# Patient Record
Sex: Male | Born: 1975 | Race: Black or African American | Hispanic: No | Smoking: Never smoker
Health system: Southern US, Community
[De-identification: ages and names within clinical notes are randomized; demographics above are authoritative.]

## PROBLEM LIST (undated history)

## (undated) DIAGNOSIS — K219 Gastro-esophageal reflux disease without esophagitis: Secondary | ICD-10-CM

## (undated) DIAGNOSIS — M5412 Radiculopathy, cervical region: Secondary | ICD-10-CM

## (undated) DIAGNOSIS — F99 Mental disorder, not otherwise specified: Secondary | ICD-10-CM

## (undated) HISTORY — DX: Mental disorder, not otherwise specified: F99

## (undated) HISTORY — DX: Gastro-esophageal reflux disease without esophagitis: K21.9

## (undated) HISTORY — PX: NECK SURGERY: SHX720

## (undated) HISTORY — DX: Radiculopathy, cervical region: M54.12

---

## 2007-09-01 ENCOUNTER — Emergency Department (HOSPITAL_COMMUNITY): Admission: EM | Admit: 2007-09-01 | Discharge: 2007-09-01 | Payer: Self-pay | Admitting: Emergency Medicine

## 2018-11-10 ENCOUNTER — Encounter (HOSPITAL_COMMUNITY): Payer: Self-pay

## 2018-11-10 ENCOUNTER — Other Ambulatory Visit: Payer: Self-pay

## 2018-11-10 ENCOUNTER — Emergency Department (HOSPITAL_COMMUNITY): Payer: Self-pay

## 2018-11-10 ENCOUNTER — Emergency Department (HOSPITAL_COMMUNITY)
Admission: EM | Admit: 2018-11-10 | Discharge: 2018-11-11 | Discharge status: Against Medical Advice | Payer: Self-pay | Attending: Emergency Medicine | Admitting: Emergency Medicine

## 2018-11-10 DIAGNOSIS — Z5321 Procedure and treatment not carried out due to patient leaving prior to being seen by health care provider: Secondary | ICD-10-CM | POA: Insufficient documentation

## 2018-11-10 LAB — TROPONIN I (HIGH SENSITIVITY): Troponin I (High Sensitivity): <2 ng/L (ref <18)

## 2018-11-10 LAB — CBC
HCT: 38.5 % — ABNORMAL LOW (ref 39.0–52.0)
Hemoglobin: 13.6 g/dL (ref 13.0–17.0)
MCH: 32.2 pg (ref 26.0–34.0)
MCHC: 35.3 g/dL (ref 30.0–36.0)
MCV: 91 fL (ref 80.0–100.0)
Platelets: 131 10*3/uL — ABNORMAL LOW (ref 150–400)
RBC: 4.23 MIL/uL (ref 4.22–5.81)
RDW: 12.6 % (ref 11.5–15.5)
WBC: 24.2 10*3/uL — ABNORMAL HIGH (ref 4.0–10.5)
nRBC: 0 % (ref 0.0–0.2)

## 2018-11-10 LAB — BASIC METABOLIC PANEL
Anion gap: 12 (ref 5–15)
BUN: 12 mg/dL (ref 6–20)
CO2: 23 mmol/L (ref 22–32)
Calcium: 8.6 mg/dL — ABNORMAL LOW (ref 8.9–10.3)
Chloride: 103 mmol/L (ref 98–111)
Creatinine, Ser: 1.18 mg/dL (ref 0.61–1.24)
GFR calc Af Amer: >60 mL/min (ref >60)
GFR calc non Af Amer: >60 mL/min (ref >60)
Glucose, Bld: 103 mg/dL — ABNORMAL HIGH (ref 70–99)
Potassium: 3.2 mmol/L — ABNORMAL LOW (ref 3.5–5.1)
Sodium: 138 mmol/L (ref 135–145)

## 2018-11-10 MED ORDER — SODIUM CHLORIDE 0.9% FLUSH: 3.0000 mL | Freq: Once | INTRAVENOUS | Status: DC

## 2018-11-10 NOTE — ED Notes (Signed)
Called pt no answer x1 

## 2018-11-10 NOTE — ED Triage Notes (Signed)
Pt reports chest pain and sob since yesterday. Resp e.u, nad noted.

## 2018-11-11 NOTE — ED Notes (Signed)
Pt called x3. No reply. 

## 2020-02-19 IMAGING — CR DG CHEST 2V
2 series · 2 of 2 positions shown · non-contrast
Comparison: None

CLINICAL DATA: Chest pain and tightness.  Shortness of breath.

EXAM:
CHEST - 2 VIEW

[chest pa]
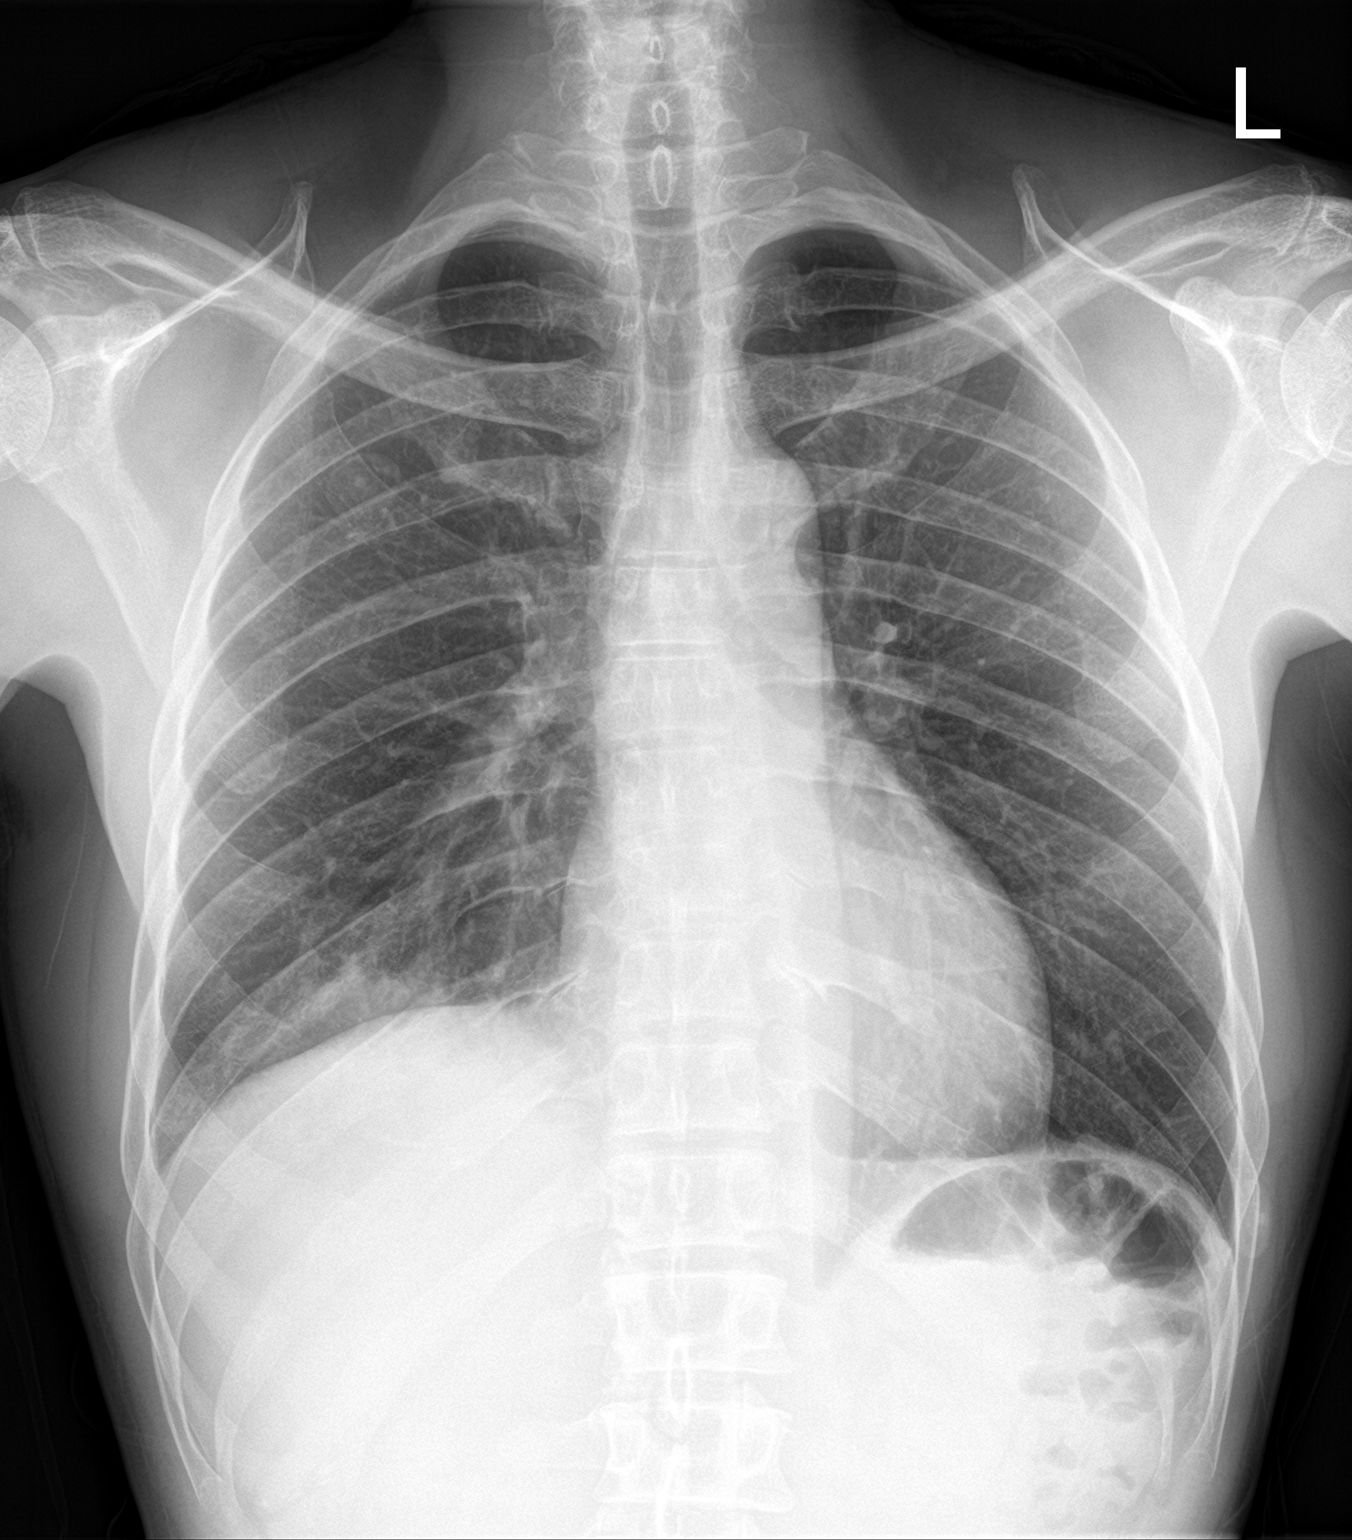

[chest lat]
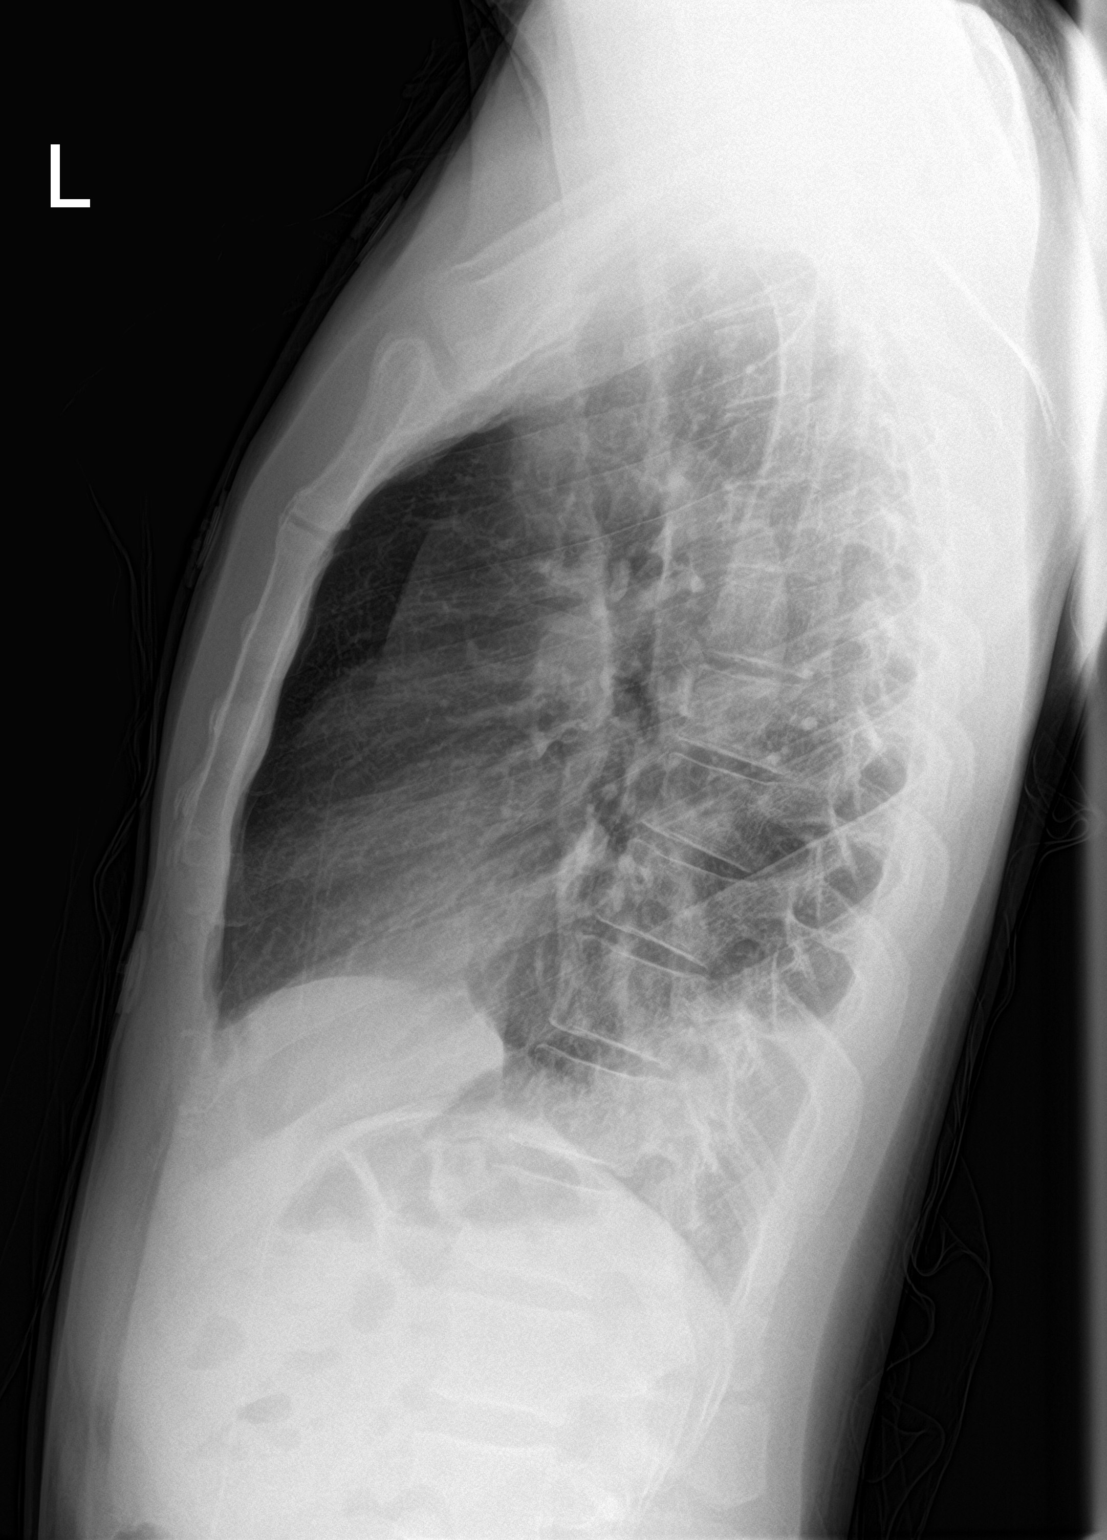

[2 of 2 positions shown; findings below may reference images not displayed]

FINDINGS: The heart size is normal. There is no edema or effusion. Right lower
lobe pneumonia is present. The left lung is clear.
IMPRESSION: Posterior right lower lobe pneumonia.

## 2020-02-29 ENCOUNTER — Other Ambulatory Visit: Payer: Self-pay

## 2020-02-29 ENCOUNTER — Emergency Department (HOSPITAL_COMMUNITY)
Admission: EM | Admit: 2020-02-29 | Discharge: 2020-02-29 | Disposition: A | Discharge status: Home / Self Care | Payer: Self-pay | Attending: Emergency Medicine | Admitting: Emergency Medicine

## 2020-02-29 DIAGNOSIS — Z5321 Procedure and treatment not carried out due to patient leaving prior to being seen by health care provider: Secondary | ICD-10-CM | POA: Insufficient documentation

## 2020-02-29 DIAGNOSIS — R109 Unspecified abdominal pain: Secondary | ICD-10-CM | POA: Insufficient documentation

## 2020-02-29 LAB — CBC
HCT: 40.1 % (ref 39.0–52.0)
Hemoglobin: 13.3 g/dL (ref 13.0–17.0)
MCH: 31.2 pg (ref 26.0–34.0)
MCHC: 33.2 g/dL (ref 30.0–36.0)
MCV: 94.1 fL (ref 80.0–100.0)
Platelets: 157 10*3/uL (ref 150–400)
RBC: 4.26 MIL/uL (ref 4.22–5.81)
RDW: 12.6 % (ref 11.5–15.5)
WBC: 4.8 10*3/uL (ref 4.0–10.5)
nRBC: 0 % (ref 0.0–0.2)

## 2020-02-29 LAB — LIPASE, BLOOD: Lipase: 88 U/L — ABNORMAL HIGH (ref 11–51)

## 2020-02-29 LAB — COMPREHENSIVE METABOLIC PANEL
ALT: 20 U/L (ref 0–44)
AST: 19 U/L (ref 15–41)
Albumin: 4.2 g/dL (ref 3.5–5.0)
Alkaline Phosphatase: 61 U/L (ref 38–126)
Anion gap: 12 (ref 5–15)
BUN: 15 mg/dL (ref 6–20)
CO2: 24 mmol/L (ref 22–32)
Calcium: 9.1 mg/dL (ref 8.9–10.3)
Chloride: 103 mmol/L (ref 98–111)
Creatinine, Ser: 1.14 mg/dL (ref 0.61–1.24)
GFR, Estimated: >60 mL/min (ref >60)
Glucose, Bld: 100 mg/dL — ABNORMAL HIGH (ref 70–99)
Potassium: 4.1 mmol/L (ref 3.5–5.1)
Sodium: 139 mmol/L (ref 135–145)
Total Bilirubin: 1 mg/dL (ref 0.3–1.2)
Total Protein: 7.3 g/dL (ref 6.5–8.1)

## 2020-02-29 NOTE — ED Notes (Signed)
Called pt name x4 to be roomed. No response from pt.  

## 2020-02-29 NOTE — ED Triage Notes (Signed)
Patient complains of abdominal burning and thinks he has H. Pylori again. Patient alert and oriented. Also wants covid test

## 2021-02-10 ENCOUNTER — Encounter (HOSPITAL_COMMUNITY): Payer: Self-pay | Admitting: *Deleted

## 2021-02-10 ENCOUNTER — Other Ambulatory Visit: Payer: Self-pay

## 2021-02-10 ENCOUNTER — Ambulatory Visit (HOSPITAL_COMMUNITY)
Admission: EM | Admit: 2021-02-10 | Discharge: 2021-02-10 | Disposition: A | Discharge status: Home / Self Care | Payer: Self-pay | Attending: Family Medicine | Admitting: Family Medicine

## 2021-02-10 DIAGNOSIS — M5412 Radiculopathy, cervical region: Secondary | ICD-10-CM

## 2021-02-10 DIAGNOSIS — R0602 Shortness of breath: Secondary | ICD-10-CM

## 2021-02-10 MED ORDER — PREDNISONE 20 MG PO TABS: 40.0000 mg | ORAL_TABLET | Freq: Every day | ORAL | 0 refills | Status: AC

## 2021-02-10 NOTE — ED Triage Notes (Signed)
Pt reports rt arm numbness for 2 weeks. Pt reports SHOB started last night . No other Sx's. Family told him he should come get checked out.

## 2021-02-10 NOTE — Discharge Instructions (Signed)
Prednisone 20 mg 2 daily for 5 days  Call the [facility] office on Tuesday when they were open to check on your application status

## 2021-02-10 NOTE — ED Provider Notes (Signed)
[facility]    CSN: [id number] Arrival date & time: 02/10/21  1511      History   Chief Complaint Chief Complaint  Patient presents with   Shortness of Breath   arm numbness    RT    HPI Jeffrey Meyers is a 46 y.o. male.    Shortness of Breath Here for a 2 to 3-week history of his right arm feeling numb.  He has actually been dealing with chronic neck pain and spinal problems for several years.  He has received steroids in the past when this was bothering him more.  No recent trauma.  No bowel or bladder incontinence. No recent fever or chills or cough.  No headache.  No dysarthria.  Since last evening he has noted a little bit of shortness of breath.  He acknowledges he could have been worried about things.  He does feel a pull in his left anterior chest.  No chest heaviness or pressure on his chest.  He had applied and gotten food stamps in about October.  He was told that he should be receiving his Medicaid card then also, but he has not received anything.  Today when eligibility was checked he was told that he did not have Medicaid coverage  History reviewed. No pertinent past medical history.  There are no problems to display for this patient.   Past Surgical History:  Procedure Laterality Date   NECK SURGERY         Home Medications    Prior to Admission medications   Medication Sig Start Date End Date Taking? Authorizing Provider  predniSONE (DELTASONE) 20 MG tablet Take 2 tablets (40 mg total) by mouth daily with breakfast for 5 days. 02/10/21 02/15/21 Yes Zenia Resides, MD    Family History History reviewed. No pertinent family history.  Social History Social History   Tobacco Use   Smoking status: Never   Smokeless tobacco: Never     Allergies   Patient has no known allergies.   Review of Systems Review of Systems  Respiratory:  Positive for shortness of breath.     Physical Exam Triage Vital Signs ED Triage Vitals  Enc  Vitals Group     BP 02/10/21 1524 (!) 144/89     Pulse Rate 02/10/21 1524 (!) 58     Resp 02/10/21 1524 18     Temp 02/10/21 1524 98.4 F (36.9 C)     Temp src --      SpO2 02/10/21 1524 98 %     Weight --      Height --      Head Circumference --      Peak Flow --      Pain Score 02/10/21 1521 9     Pain Loc --      Pain Edu? --      Excl. in GC? --    No data found.  Updated Vital Signs BP (!) 144/89    Pulse (!) 58    Temp 98.4 F (36.9 C)    Resp 18    SpO2 98%   Visual Acuity Right Eye Distance:   Left Eye Distance:   Bilateral Distance:    Right Eye Near:   Left Eye Near:    Bilateral Near:     Physical Exam Vitals reviewed.  Constitutional:      General: He is not in acute distress.    Appearance: He is not ill-appearing, toxic-appearing or diaphoretic.  HENT:     Head: Normocephalic and atraumatic.     Nose: Nose normal.     Mouth/Throat:     Mouth: Mucous membranes are moist.  Eyes:     Extraocular Movements: Extraocular movements intact.     Conjunctiva/sclera: Conjunctivae normal.     Pupils: Pupils are equal, round, and reactive to light.  Cardiovascular:     Rate and Rhythm: Normal rate and regular rhythm.     Heart sounds: No murmur heard. Pulmonary:     Effort: Pulmonary effort is normal. No respiratory distress.     Breath sounds: No stridor. No wheezing, rhonchi or rales.  Musculoskeletal:     Cervical back: Neck supple.  Lymphadenopathy:     Cervical: No cervical adenopathy.  Skin:    Capillary Refill: Capillary refill takes less than 2 seconds.     Findings: Rash (faint dry rash on right lower abdomen) present.  Neurological:     Mental Status: He is alert and oriented to person, place, and time.     Comments: Decreased LT sensation over right arm  Psychiatric:        Behavior: Behavior normal.     UC Treatments / Results  Labs (all labs ordered are listed, but only abnormal results are displayed) Labs Reviewed - No data to  display  EKG   Radiology No results found.  Procedures Procedures (including critical care time)  Medications Ordered in UC Medications - No data to display  Initial Impression / Assessment and Plan / UC Course  I have reviewed the triage vital signs and the nursing notes.  Pertinent labs & imaging results that were available during my care of the patient were reviewed by me and considered in my medical decision making (see chart for details).     Discussed the arm numbness, most likely due to cervical radiculopathy. Will try 5 days of prednisone to see if helps.  Assitance requested to find him a pcp; he will call the medicaid office to check status of his application  Since his lungs were clear, we decided against xrays today. Final Clinical Impressions(s) / UC Diagnoses   Final diagnoses:  Cervical radiculopathy  Shortness of breath     Discharge Instructions      Prednisone 20 mg 2 daily for 5 days  Call the [facility] office on Tuesday when they were open to check on your application status     ED Prescriptions     Medication Sig Dispense Auth. Provider   predniSONE (DELTASONE) 20 MG tablet Take 2 tablets (40 mg total) by mouth daily with breakfast for 5 days. 10 tablet Marlinda Mike Janace Aris, MD      PDMP not reviewed this encounter.   Zenia Resides, MD 02/10/21 [phone]

## 2021-04-16 ENCOUNTER — Encounter (HOSPITAL_COMMUNITY): Payer: Self-pay | Admitting: Emergency Medicine

## 2021-04-16 ENCOUNTER — Ambulatory Visit (HOSPITAL_COMMUNITY)
Admission: EM | Admit: 2021-04-16 | Discharge: 2021-04-16 | Disposition: A | Discharge status: Home / Self Care | Payer: Self-pay | Attending: Internal Medicine | Admitting: Internal Medicine

## 2021-04-16 DIAGNOSIS — M5010 Cervical disc disorder with radiculopathy, unspecified cervical region: Secondary | ICD-10-CM

## 2021-04-16 MED ORDER — GABAPENTIN 100 MG PO CAPS: 100.0000 mg | ORAL_CAPSULE | Freq: Two times a day (BID) | ORAL | 0 refills | Status: DC

## 2021-04-16 MED ORDER — METHOCARBAMOL 500 MG PO TABS: 500.0000 mg | ORAL_TABLET | Freq: Every day | ORAL | 1 refills | Status: DC

## 2021-04-16 MED ORDER — IBUPROFEN 600 MG PO TABS: 600.0000 mg | ORAL_TABLET | Freq: Four times a day (QID) | ORAL | 0 refills | Status: DC | PRN

## 2021-04-16 NOTE — ED Triage Notes (Signed)
Pt had doctor while in prison for his neck, right shoulder and arm pains. Pt reports when he got released 3 years ago and was seeing provider but when covid came he hasnt had been able to get seen and get medications for the pains.  ?Pt reports that has numbness with pains as well ?

## 2021-04-16 NOTE — Discharge Instructions (Addendum)
Gentle range of motion exercises ?Heating pad use only 20 minutes on-20 minutes off cycle ?Please take medications as prescribed ?If symptoms persist or worsens please return to urgent care to be reevaluated ?

## 2021-04-17 NOTE — ED Provider Notes (Signed)
?[facility] ? ? ? ?CSN: [id number] ?Arrival date & time: 04/16/21  1007 ? ? ?  ? ?History   ?Chief Complaint ?Chief Complaint  ?Patient presents with  ? Neck Pain  ? Arm Pain  ? ? ?HPI ?Jeffrey Meyers is a 46 y.o. male with a history of cervical radiculopathy with right upper extremity numbness comes to urgent care with worsening neck pain and right upper extremity numbness.  Patient was released from prison a few years ago.  Prior to his release from prison he was being managed with muscle relaxants and gabapentin.  After being released from prison he has not been able to do establish primary care physician.  He has been using the neck collar and over-the-counter pain medications for pain control.  He describes the pain as moderate severity with persistent numbness in the right upper extremity.  No recent fall.  No weakness in the right hand.  He is now dropping objects.  No trauma to the neck.  Patient was seen a few weeks ago and given a course of steroids with no improvement in his symptoms..  ? ?HPI ? ?History reviewed. No pertinent past medical history. ? ?There are no problems to display for this patient. ? ? ?Past Surgical History:  ?Procedure Laterality Date  ? NECK SURGERY    ? ? ? ? ? ?Home Medications   ? ?Prior to Admission medications   ?Medication Sig Start Date End Date Taking? Authorizing Provider  ?gabapentin (NEURONTIN) 100 MG capsule Take 1 capsule (100 mg total) by mouth 2 (two) times daily. 04/16/21 05/16/21 Yes Jamar Casagrande, Britta Mccreedy, MD  ?ibuprofen (ADVIL) 600 MG tablet Take 1 tablet (600 mg total) by mouth every 6 (six) hours as needed. 04/16/21  Yes Levell Tavano, Britta Mccreedy, MD  ?methocarbamol (ROBAXIN) 500 MG tablet Take 1 tablet (500 mg total) by mouth at bedtime. 04/16/21  Yes Jermaine Tholl, Britta Mccreedy, MD  ? ? ?Family History ?No family history on file. ? ?Social History ?Social History  ? ?Tobacco Use  ? Smoking status: Never  ? Smokeless tobacco: Never  ? ? ? ?Allergies   ?Patient has no known  allergies. ? ? ?Review of Systems ?Review of Systems  ?HENT: Negative.    ?Respiratory: Negative.    ?Gastrointestinal: Negative.   ?Genitourinary: Negative.   ?Musculoskeletal:  Positive for neck pain and neck stiffness. Negative for back pain.  ?Neurological:  Positive for numbness. Negative for dizziness, weakness and headaches.  ? ? ?Physical Exam ?Triage Vital Signs ?ED Triage Vitals  ?Enc Vitals Group  ?   BP 04/16/21 1130 123/85  ?   Pulse Rate 04/16/21 1130 70  ?   Resp 04/16/21 1130 17  ?   Temp 04/16/21 1130 97.8 ?F (36.6 ?C)  ?   Temp Source 04/16/21 1130 Oral  ?   SpO2 04/16/21 1130 100 %  ?   Weight --   ?   Height --   ?   Head Circumference --   ?   Peak Flow --   ?   Pain Score 04/16/21 1128 8  ?   Pain Loc --   ?   Pain Edu? --   ?   Excl. in GC? --   ? ?No data found. ? ?Updated Vital Signs ?BP 123/85 (BP Location: Left Arm)   Pulse 70   Temp 97.8 ?F (36.6 ?C) (Oral)   Resp 17   SpO2 100%  ? ?Visual Acuity ?Right Eye Distance:   ?Left  Eye Distance:   ?Bilateral Distance:   ? ?Right Eye Near:   ?Left Eye Near:    ?Bilateral Near:    ? ?Physical Exam ?Vitals and nursing note reviewed.  ?Constitutional:   ?   General: He is not in acute distress. ?   Appearance: He is not ill-appearing.  ?Cardiovascular:  ?   Rate and Rhythm: Normal rate and regular rhythm.  ?Pulmonary:  ?   Effort: Pulmonary effort is normal.  ?   Breath sounds: Normal breath sounds.  ?Abdominal:  ?   General: Bowel sounds are normal.  ?   Palpations: Abdomen is soft.  ?Musculoskeletal:     ?   General: No swelling, tenderness or deformity. Normal range of motion.  ?   Cervical back: Normal range of motion and neck supple. No rigidity or tenderness.  ?Lymphadenopathy:  ?   Cervical: No cervical adenopathy.  ?Skin: ?   General: Skin is warm.  ?Neurological:  ?   General: No focal deficit present.  ?   Mental Status: He is alert and oriented to person, place, and time.  ?   Sensory: No sensory deficit.  ?   Motor: No weakness.  ?    Deep Tendon Reflexes: Reflexes normal.  ?Psychiatric:     ?   Mood and Affect: Mood normal.  ? ? ? ?UC Treatments / Results  ?Labs ?(all labs ordered are listed, but only abnormal results are displayed) ?Labs Reviewed - No data to display ? ?EKG ? ? ?Radiology ?No results found. ? ?Procedures ?Procedures (including critical care time) ? ?Medications Ordered in UC ?Medications - No data to display ? ?Initial Impression / Assessment and Plan / UC Course  ?I have reviewed the triage vital signs and the nursing notes. ? ?Pertinent labs & imaging results that were available during my care of the patient were reviewed by me and considered in my medical decision making (see chart for details). ? ?  ? ?1.  Cervical radiculopathy with right upper extremity numbness: ?Refill gabapentin 100 mg twice daily ?Robaxin at bedtime as needed for muscle stiffness-precautions given ?Ibuprofen as needed for pain ?Gentle range of motion exercises ?We set the patient up to see her primary care physician in July 28, 2021 at 9 AM ?Previous medical records were reviewed. ?Final Clinical Impressions(s) / UC Diagnoses  ? ?Final diagnoses:  ?Cervical disc disorder with radiculopathy of cervical region  ? ? ? ?Discharge Instructions   ? ?  ?Gentle range of motion exercises ?Heating pad use only 20 minutes on-20 minutes off cycle ?Please take medications as prescribed ?If symptoms persist or worsens please return to urgent care to be reevaluated ? ? ?ED Prescriptions   ? ? Medication Sig Dispense Auth. Provider  ? gabapentin (NEURONTIN) 100 MG capsule Take 1 capsule (100 mg total) by mouth 2 (two) times daily. 60 capsule Santiana Glidden, Britta Mccreedy, MD  ? methocarbamol (ROBAXIN) 500 MG tablet Take 1 tablet (500 mg total) by mouth at bedtime. 30 tablet Donavon Kimrey, Britta Mccreedy, MD  ? ibuprofen (ADVIL) 600 MG tablet Take 1 tablet (600 mg total) by mouth every 6 (six) hours as needed. 30 tablet Kym Scannell, Britta Mccreedy, MD  ? ?  ? ?PDMP not reviewed this encounter. ?   ?Merrilee Jansky, MD ?04/17/21 1745 ? ?

## 2021-08-07 ENCOUNTER — Ambulatory Visit: Payer: Medicaid Other | Attending: Critical Care Medicine | Admitting: Critical Care Medicine

## 2021-08-07 ENCOUNTER — Encounter: Payer: Self-pay | Admitting: Critical Care Medicine

## 2021-08-07 ENCOUNTER — Other Ambulatory Visit: Payer: Self-pay

## 2021-08-07 ENCOUNTER — Telehealth: Payer: Self-pay

## 2021-08-07 ENCOUNTER — Other Ambulatory Visit (HOSPITAL_COMMUNITY)
Admission: RE | Admit: 2021-08-07 | Discharge: 2021-08-07 | Disposition: A | Discharge status: Home / Self Care | Payer: Medicaid Other | Source: Ambulatory Visit | Attending: Critical Care Medicine | Admitting: Critical Care Medicine

## 2021-08-07 VITALS — BP 123/71 | HR 50 | Temp 98.6°F | Ht 67.0 in | Wt 141.0 lb

## 2021-08-07 DIAGNOSIS — Z5902 Unsheltered homelessness: Secondary | ICD-10-CM

## 2021-08-07 DIAGNOSIS — K2950 Unspecified chronic gastritis without bleeding: Secondary | ICD-10-CM

## 2021-08-07 DIAGNOSIS — F339 Major depressive disorder, recurrent, unspecified: Secondary | ICD-10-CM | POA: Insufficient documentation

## 2021-08-07 DIAGNOSIS — Z113 Encounter for screening for infections with a predominantly sexual mode of transmission: Secondary | ICD-10-CM

## 2021-08-07 DIAGNOSIS — Z652 Problems related to release from prison: Secondary | ICD-10-CM

## 2021-08-07 DIAGNOSIS — R1013 Epigastric pain: Secondary | ICD-10-CM

## 2021-08-07 DIAGNOSIS — K219 Gastro-esophageal reflux disease without esophagitis: Secondary | ICD-10-CM

## 2021-08-07 DIAGNOSIS — M5412 Radiculopathy, cervical region: Secondary | ICD-10-CM

## 2021-08-07 DIAGNOSIS — R109 Unspecified abdominal pain: Secondary | ICD-10-CM | POA: Insufficient documentation

## 2021-08-07 DIAGNOSIS — R1030 Lower abdominal pain, unspecified: Secondary | ICD-10-CM

## 2021-08-07 DIAGNOSIS — Z114 Encounter for screening for human immunodeficiency virus [HIV]: Secondary | ICD-10-CM

## 2021-08-07 DIAGNOSIS — Z1211 Encounter for screening for malignant neoplasm of colon: Secondary | ICD-10-CM

## 2021-08-07 DIAGNOSIS — Z139 Encounter for screening, unspecified: Secondary | ICD-10-CM | POA: Insufficient documentation

## 2021-08-07 DIAGNOSIS — K861 Other chronic pancreatitis: Secondary | ICD-10-CM | POA: Insufficient documentation

## 2021-08-07 DIAGNOSIS — Z1159 Encounter for screening for other viral diseases: Secondary | ICD-10-CM

## 2021-08-07 DIAGNOSIS — F99 Mental disorder, not otherwise specified: Secondary | ICD-10-CM

## 2021-08-07 MED ORDER — ARIPIPRAZOLE 5 MG PO TABS
5.0000 mg | ORAL_TABLET | Freq: Every day | ORAL | 1 refills | Status: AC
  Filled 2021-08-07: qty 30, 30d supply, fill #0

## 2021-08-07 MED ORDER — METHOCARBAMOL 500 MG PO TABS
500.0000 mg | ORAL_TABLET | Freq: Four times a day (QID) | ORAL | 1 refills | Status: AC | PRN
  Filled 2021-08-07: qty 120, 30d supply, fill #0

## 2021-08-07 MED ORDER — PANTOPRAZOLE SODIUM 40 MG PO TBEC
40.0000 mg | DELAYED_RELEASE_TABLET | Freq: Every day | ORAL | 3 refills | Status: AC
  Filled 2021-08-07: qty 30, 30d supply, fill #0

## 2021-08-07 MED ORDER — GABAPENTIN 100 MG PO CAPS
100.0000 mg | ORAL_CAPSULE | Freq: Three times a day (TID) | ORAL | 2 refills | Status: AC
  Filled 2021-08-07: qty 90, 30d supply, fill #0

## 2021-08-07 NOTE — Progress Notes (Unsigned)
New Patient Office Visit  Subjective    Patient ID: Jeffrey Meyers, male    DOB: 10-25-1975  Age: 46 y.o. MRN: [id number]  CC:  Chief Complaint  Patient presents with   New Patient (Initial Visit)    HPI Jeffrey Meyers presents to establish care. He has history of cervical radiculopathy, GERD and mental health issues. He was seen in ED in March for neck pain and given Gabapentin and Robaxin, with relief. Also was given ibuprofen however patient hestiant to use due to GERD history.  Patient reports he has been without medications since his release from prison 3 years ago. States he was diagnosed with a mental health disorder and treated with an oral pill daily while in prison, but is unaware of the diagnosis/medication.   We were records from the prison system this is yet to be seen  Currently patient is also experiencing lower abdominal pain. Pain is located in the umbilical region and radiates down bilaterally. He has been very constipated lately and attributes it to the medications previously given in the ER. Denies nausea, vomiting, melena or hematochezia. He is sexually active with one partner, unsure of last STI screen.   The patient's main issue is that of severe neck pain he was actually seeking opiates was informed we do not prescribe opiates at this clinic  Patient has no insurance he will need to get the orange card as soon as possible  He is homeless he is couch surfing with his significant other see associated notes from case management   Outpatient Encounter Medications as of 08/07/2021  Medication Sig   ARIPiprazole (ABILIFY) 5 MG tablet Take 1 tablet (5 mg total) by mouth at bedtime.   pantoprazole (PROTONIX) 40 MG tablet Take 1 tablet (40 mg total) by mouth daily.   [DISCONTINUED] ibuprofen (ADVIL) 600 MG tablet Take 1 tablet (600 mg total) by mouth every 6 (six) hours as needed.   [DISCONTINUED] methocarbamol (ROBAXIN) 500 MG tablet Take 1 tablet (500 mg total) by  mouth at bedtime.   gabapentin (NEURONTIN) 100 MG capsule Take 1 capsule (100 mg total) by mouth 3 (three) times daily.   methocarbamol (ROBAXIN) 500 MG tablet Take 1 tablet (500 mg total) by mouth every 6 (six) hours as needed for muscle spasms.   [DISCONTINUED] gabapentin (NEURONTIN) 100 MG capsule Take 1 capsule (100 mg total) by mouth 2 (two) times daily.   No facility-administered encounter medications on file as of 08/07/2021.    Past Medical History:  Diagnosis Date   Cervical radiculopathy    GERD (gastroesophageal reflux disease)    Mental health disorder     History reviewed. No pertinent surgical history.   Family History  Problem Relation Age of Onset   Dementia Mother     Social History   Socioeconomic History   Marital status: Significant Other    Spouse name: Not on file   Number of children: 1   Years of education: Not on file   Highest education level: Not on file  Occupational History   Occupation: unemployed- waiting on disability  Tobacco Use   Smoking status: Never   Smokeless tobacco: Never  Vaping Use   Vaping Use: Never used  Substance and Sexual Activity   Alcohol use: Never   Drug use: Yes    Frequency: 7.0 times per week    Types: Marijuana    Comment: three times daily   Sexual activity: Yes    Birth control/protection: None  Other  Topics Concern   Not on file  Social History Narrative   Not on file   Social Determinants of Health   Financial Resource Strain: Not on file  Food Insecurity: Not on file  Transportation Needs: Not on file  Physical Activity: Not on file  Stress: Not on file  Social Connections: Not on file  Intimate Partner Violence: Not on file    Review of Systems  Constitutional:  Negative for chills, diaphoresis, fever, malaise/fatigue and weight loss.  HENT:  Negative for congestion, hearing loss, nosebleeds, sore throat and tinnitus.   Eyes:  Negative for blurred vision, photophobia and redness.   Respiratory:  Negative for cough, hemoptysis, sputum production, shortness of breath, wheezing and stridor.   Cardiovascular:  Negative for chest pain, palpitations, orthopnea, claudication, leg swelling and PND.  Gastrointestinal:  Positive for abdominal pain and heartburn. Negative for blood in stool, constipation, diarrhea, nausea and vomiting.  Genitourinary:  Negative for dysuria, flank pain, frequency, hematuria and urgency.  Musculoskeletal:  Positive for back pain and neck pain. Negative for falls, joint pain and myalgias.  Skin:  Negative for itching and rash.  Neurological:  Positive for headaches. Negative for dizziness, tingling, tremors, sensory change, speech change, focal weakness, seizures, loss of consciousness and weakness.  Endo/Heme/Allergies:  Negative for environmental allergies and polydipsia. Does not bruise/bleed easily.  Psychiatric/Behavioral:  Positive for depression. Negative for hallucinations, memory loss, substance abuse and suicidal ideas. The patient is nervous/anxious. The patient does not have insomnia.   All other systems reviewed and are negative.       Objective    BP 123/71   Pulse (!) 50   Temp 98.6 F (37 C) (Oral)   Ht _0  (1.702 m)   Wt 141 lb (64 kg)   SpO2 99%   BMI 22.08 kg/m   Physical Exam Vitals reviewed.  Constitutional:      Appearance: Normal appearance. He is well-developed. He is not diaphoretic.  HENT:     Head: Normocephalic and atraumatic.     Right Ear: Tympanic membrane, ear canal and external ear normal.     Left Ear: Tympanic membrane, ear canal and external ear normal.     Nose: Nose normal. No nasal deformity, septal deviation, mucosal edema or rhinorrhea.     Right Sinus: No maxillary sinus tenderness or frontal sinus tenderness.     Left Sinus: No maxillary sinus tenderness or frontal sinus tenderness.     Mouth/Throat:     Mouth: Mucous membranes are moist.     Pharynx: Oropharynx is clear. No oropharyngeal  exudate.  Eyes:     General: No scleral icterus.    Conjunctiva/sclera: Conjunctivae normal.     Pupils: Pupils are equal, round, and reactive to light.  Neck:     Thyroid: No thyromegaly.     Vascular: No carotid bruit or JVD.     Trachea: Trachea normal. No tracheal tenderness or tracheal deviation.  Cardiovascular:     Rate and Rhythm: Normal rate and regular rhythm.     Chest Wall: PMI is not displaced.     Pulses: Normal pulses. No decreased pulses.     Heart sounds: Normal heart sounds, S1 normal and S2 normal. Heart sounds not distant. No murmur heard.    No systolic murmur is present.     No diastolic murmur is present.     No friction rub. No gallop. No S3 or S4 sounds.  Pulmonary:     Effort: Pulmonary  effort is normal. No tachypnea, accessory muscle usage or respiratory distress.     Breath sounds: Normal breath sounds. No stridor. No decreased breath sounds, wheezing, rhonchi or rales.  Chest:     Chest wall: No tenderness.  Abdominal:     General: Bowel sounds are normal. There is no distension.     Palpations: Abdomen is soft. Abdomen is not rigid.     Tenderness: There is abdominal tenderness (umblical region and lower bilaterally, no rebound tenderness). There is no guarding or rebound.  Musculoskeletal:        General: Tenderness present. Normal range of motion.     Cervical back: Normal range of motion and neck supple. Tenderness present. No edema, erythema or rigidity. No muscular tenderness. Normal range of motion.     Comments: Tenderness at base of neck decreased range of motion of the neck  Lymphadenopathy:     Head:     Right side of head: No submental or submandibular adenopathy.     Left side of head: No submental or submandibular adenopathy.     Cervical: No cervical adenopathy.  Skin:    General: Skin is warm and dry.     Coloration: Skin is not pale.     Findings: No rash.     Nails: There is no clubbing.  Neurological:     General: No focal  deficit present.     Mental Status: He is alert and oriented to person, place, and time.     Sensory: No sensory deficit.  Psychiatric:        Attention and Perception: Attention and perception normal.        Mood and Affect: Mood is anxious.        Speech: Speech normal.        Behavior: Behavior normal. Behavior is cooperative.        Thought Content: Thought content normal. Thought content does not include homicidal or suicidal ideation. Thought content does not include homicidal or suicidal plan.        Cognition and Memory: Cognition and memory normal.        Judgment: Judgment normal.         Assessment & Plan:   Problem List Items Addressed This Visit       Digestive   GERD (gastroesophageal reflux disease) (Chronic)    Try to avoid nonsteroidals given that he has gastropathy with regards to his cervical spine treatments Patient has had H. pylori in the past may well have recurrence and needs to see GI  Stop NSAIDs Pantoprazole started GI referral will be sent after patient gets financial assistance  diet and lifestyle modifications encouraged  H Pylori test ordered patient left before this could be achieved we will try to bring him back for a study      Relevant Medications   pantoprazole (PROTONIX) 40 MG tablet   Idiopathic chronic pancreatitis ([place])    He has had a prior history of chronic pancreatitis by history again would need records from the prison system He steadfastly denies alcohol use Previous elevated lipase  Complete metabolic panel, CBC and US liver ordered       Relevant Medications   pantoprazole (PROTONIX) 40 MG tablet   Other Relevant Orders   US Abdomen Complete     Nervous and Auditory   Cervical radiculopathy - Primary (Chronic)    Your cervical disc disease need to rule out cervical stenosis.  Do not have old records.  There  is an old cervical spine film in 2009 showing facet arthropathy C7-T1 Patient needs to get the orange card so  that we can get him over to orthopedic spine Patient made aware we cannot prescribe opiates at this clinic And need to get old records from the prison system   Resume methocarbamol 4 times daily  Resume gabapentin 3 times daily  Referral to orthopedic spine will be sent after patient gets financial assistance       Relevant Medications   methocarbamol (ROBAXIN) 500 MG tablet   gabapentin (NEURONTIN) 100 MG capsule   ARIPiprazole (ABILIFY) 5 MG tablet     Other   Major depression, recurrent, chronic ([place])    Patient given location of [facility] behavioral health in his AVS he is to go there to the walk-in clinic for assessment Likely  has some form of schizoaffective disorder, I am not sure he has schizophrenia per se and I suspect if so with that diagnosis has been unjustly attached to him He clearly has major depressive disorder as well but is not suicidal   Abilify started Referral to [facility]       Encounter for health-related screening    Screening labs done today include: CBC with differential/platelet, Comprehensive metabolic panel, fecal occult, HCV, Hemoglobin A1C, Lipids, HIV      Relevant Orders   Comprehensive metabolic panel (Completed)   Hemoglobin A1c (Completed)   CBC with Differential/Platelet (Completed)   Lipid panel (Completed)   Abdominal pain    As per pancreatitis and GERD assessments   US abdomen, RPR and cytology non pap ordered      Relevant Orders   US Abdomen Complete   Unsheltered homelessness with safe environment for sleeping    Patient is couch surfing with his girlfriend and they do also have a vehicle they sometimes sleep   Was seen by case management:  At the request of Dr Joya Gaskins, I met with the patient when he was in the clinic today. He explained that he and his significant other are homeless and have been couch surfing or staying in their car.  She said that they were staying with her family but had to  leave and he  could not give a specific reason why. He has no income, his significant other has no income. He was not interested in a shelter at this time    He does receive food stamps.    He explained that he has tried to work but has been fired from [employer] and another company due to his " anger issues." He would like to work but understands that he needs to be able to manage his anger first.  I reviewed the information about [facility] that was on his AVS and encouraged him to take advantage of the walk in hours for an assessment, therapy. He said he received medications for behavioral health when he was in prison but he said he was not aware of being diagnosed with a behavioral health problem or aware of what medication he was taking.    He said he has been working with someone about housing and then said it was about disability. I asked if he would like me to call that person and inquire if I can provide any assistance.  He said he has the phone number in the car and would get the number for me.  He eventually gave me a number for Ms Antler # [phone], case # [id number].  I just called that number twice and the recording does not give the name of a company and then said there is a special promotion for select callers.  I will need to follow up with the patient as this is not a person who would be assisting with disability / housing.      Screen for STD (sexually transmitted disease)    Active does not use condom      Relevant Orders   RPR (Completed)   Cytology - non pap   Other Visit Diagnoses     Encounter for screening for HIV       Relevant Orders   HIV Antibody (routine testing w rflx) (Completed)   Need for hepatitis C screening test       Relevant Orders   HCV Ab w Reflex to Quant PCR (Completed)   Colon cancer screening       Relevant Orders   Fecal occult blood, imunochemical   Other chronic gastritis without hemorrhage       Relevant Orders   H. pylori breath test     60  minutes spent conferring with case management obtaining history and physical complex decision making multisystem review significant SDOH barriers with homelessness and recent criminal justice system imprisonment  Fecal occult kit for colon cancer screening hepatitis C HIV also will be checked Return in about 6 weeks (around 09/18/2021).   Asencion Noble, MD

## 2021-08-07 NOTE — Assessment & Plan Note (Addendum)
Unsure of previous diagnosis Abilify started Referral to [facility]

## 2021-08-07 NOTE — Patient Instructions (Signed)
GO TO: [facility] [address] [place], [place] [address] [phone] or [phone] Walk-in urgent care 24/7 for anyone  For [facility] ONLY New patient assessments and therapy walk-ins: Monday and Wednesday 8am-11am First and second Friday 1pm-5pm New patient psychiatry and medication management walk-ins: Mondays, Wednesdays, Thursdays, Fridays 8am-11am No psychiatry walk-ins on Tuesday      Complete screening labs today Start pantoprazole daily for stomach pain Resume methocarbamol 4 times daily for neck pain Resume gabapentin 3 times daily for pain in the neck  Pick up [place] application for discount and orange card in process to soon as possible so we can get you into orthopedic spine and also gastroenterology  Ultrasound of the liver and abdomen will be obtained  Return to see Dr. Delford Field 6 weeks

## 2021-08-07 NOTE — Progress Notes (Unsigned)
Gabapentin medication makes him have abdominal pain.

## 2021-08-07 NOTE — Assessment & Plan Note (Addendum)
Your cervical disc disease need to rule out cervical stenosis.  Do not have old records.  There is an old cervical spine film in 2009 showing facet arthropathy C7-T1 Patient needs to get the orange card so that we can get him over to orthopedic spine Patient made aware we cannot prescribe opiates at this clinic And need to get old records from the prison system   Resume methocarbamol 4 times daily  Resume gabapentin 3 times daily  Referral to orthopedic spine will be sent after patient gets financial assistance

## 2021-08-07 NOTE — Telephone Encounter (Signed)
Rep w/ [facility] cytology states lab ordered was written incorrectly and need to be changed to  ancillary  Patient has already gave sample

## 2021-08-07 NOTE — Telephone Encounter (Signed)
At the request of Dr Joya Gaskins, I met with the patient when he was in the clinic today. He explained that he and his significant other are homeless and have been couch surfing or staying in their car.  She said that they were staying with her family but had to leave and he  could not give a specific reason why. He has no income, his significant other has no income. He was not interested in a shelter at this time   He does receive food stamps.   He explained that he has tried to work but has been fired from [employer] and another company due to his " anger issues." He would like to work but understands that he needs to be able to manage his anger first.  I reviewed the information about [facility] that was on his AVS and encouraged him to take advantage of the walk in hours for an assessment, therapy. He said he received medications for behavioral health when he was in prison but he said he was not aware of being diagnosed with a behavioral health problem or aware of what medication he was taking.   He said he has been working with someone about housing and then said it was about disability. I asked if he would like me to call that person and inquire if I can provide any assistance.  He said he has the phone number in the car and would get the number for me.  He eventually gave me a number for Ms Antler # [phone], case # [id number].   I just called that number twice and the recording does not give the name of a company and then said there is a special promotion for select callers.  I will need to follow up with the patient as this is not a person who would be assisting with disability / housing.

## 2021-08-07 NOTE — Telephone Encounter (Signed)
Order has been changed in the system.

## 2021-08-07 NOTE — Assessment & Plan Note (Addendum)
Try to avoid nonsteroidals given that he has gastropathy with regards to his cervical spine treatments Patient has had H. pylori in the past may well have recurrence and needs to see GI  Stop NSAIDs Pantoprazole started GI referral will be sent after patient gets financial assistance  diet and lifestyle modifications encouraged  H Pylori test ordered patient left before this could be achieved we will try to bring him back for a study

## 2021-08-07 NOTE — Assessment & Plan Note (Addendum)
Previous elevated lipase 1 year ago Denies alcohol use  Complete metabolic panel, CBC and US liver ordered

## 2021-08-07 NOTE — Assessment & Plan Note (Addendum)
As per pancreatitis and GERD assessments   US abdomen, RPR and cytology non pap ordered

## 2021-08-07 NOTE — Assessment & Plan Note (Addendum)
Screening labs done today include: CBC with differential/platelet, Comprehensive metabolic panel, fecal occult, HCV, Hemoglobin A1C, Lipids, HIV

## 2021-08-08 DIAGNOSIS — Z113 Encounter for screening for infections with a predominantly sexual mode of transmission: Secondary | ICD-10-CM | POA: Insufficient documentation

## 2021-08-08 DIAGNOSIS — Z5902 Unsheltered homelessness: Secondary | ICD-10-CM | POA: Insufficient documentation

## 2021-08-08 LAB — COMPREHENSIVE METABOLIC PANEL
ALT: 9 IU/L (ref 0–44)
AST: 17 IU/L (ref 0–40)
Albumin/Globulin Ratio: 1.9 (ref 1.2–2.2)
Albumin: 4.7 g/dL (ref 4.1–5.1)
Alkaline Phosphatase: 95 IU/L (ref 44–121)
BUN/Creatinine Ratio: 10 (ref 9–20)
BUN: 11 mg/dL (ref 6–24)
Bilirubin Total: 0.7 mg/dL (ref 0.0–1.2)
CO2: 24 mmol/L (ref 20–29)
Calcium: 9.2 mg/dL (ref 8.7–10.2)
Chloride: 101 mmol/L (ref 96–106)
Creatinine, Ser: 1.15 mg/dL (ref 0.76–1.27)
Globulin, Total: 2.5 g/dL (ref 1.5–4.5)
Glucose: 99 mg/dL (ref 70–99)
Potassium: 4.1 mmol/L (ref 3.5–5.2)
Sodium: 142 mmol/L (ref 134–144)
Total Protein: 7.2 g/dL (ref 6.0–8.5)
eGFR: 79 mL/min/{1.73_m2} (ref >59)

## 2021-08-08 LAB — CBC WITH DIFFERENTIAL/PLATELET
Basophils Absolute: 0 10*3/uL (ref 0.0–0.2)
Basos: 1 %
EOS (ABSOLUTE): 0.1 10*3/uL (ref 0.0–0.4)
Eos: 3 %
Hematocrit: 39.6 % (ref 37.5–51.0)
Hemoglobin: 13.2 g/dL (ref 13.0–17.7)
Immature Grans (Abs): 0 10*3/uL (ref 0.0–0.1)
Immature Granulocytes: 0 %
Lymphocytes Absolute: 1.8 10*3/uL (ref 0.7–3.1)
Lymphs: 46 %
MCH: 31 pg (ref 26.6–33.0)
MCHC: 33.3 g/dL (ref 31.5–35.7)
MCV: 93 fL (ref 79–97)
Monocytes Absolute: 0.3 10*3/uL (ref 0.1–0.9)
Monocytes: 9 %
Neutrophils Absolute: 1.6 10*3/uL (ref 1.4–7.0)
Neutrophils: 41 %
Platelets: 163 10*3/uL (ref 150–450)
RBC: 4.26 x10E6/uL (ref 4.14–5.80)
RDW: 13.2 % (ref 11.6–15.4)
WBC: 3.9 10*3/uL (ref 3.4–10.8)

## 2021-08-08 LAB — LIPID PANEL
Chol/HDL Ratio: 3 ratio (ref 0.0–5.0)
Cholesterol, Total: 136 mg/dL (ref 100–199)
HDL: 46 mg/dL (ref >39)
LDL Chol Calc (NIH): 76 mg/dL (ref 0–99)
Triglycerides: 67 mg/dL (ref 0–149)
VLDL Cholesterol Cal: 14 mg/dL (ref 5–40)

## 2021-08-08 LAB — HEMOGLOBIN A1C
Est. average glucose Bld gHb Est-mCnc: 117 mg/dL
Hgb A1c MFr Bld: 5.7 % — ABNORMAL HIGH (ref 4.8–5.6)

## 2021-08-08 LAB — RPR: RPR Ser Ql: NONREACTIVE

## 2021-08-08 LAB — HCV AB W REFLEX TO QUANT PCR: HCV Ab: NONREACTIVE

## 2021-08-08 LAB — HCV INTERPRETATION

## 2021-08-08 LAB — HIV ANTIBODY (ROUTINE TESTING W REFLEX): HIV Screen 4th Generation wRfx: NONREACTIVE

## 2021-08-08 NOTE — Assessment & Plan Note (Signed)
Active does not use condom

## 2021-08-08 NOTE — Progress Notes (Signed)
Pt made aware of results.

## 2021-08-08 NOTE — Assessment & Plan Note (Addendum)
Patient is couch surfing with his girlfriend and they do also have a vehicle they sometimes sleep   Was seen by case management:  At the request of Dr , I met with the patient when he was in the clinic today. He explained that he and his significant other are homeless and have been couch surfing or staying in their car.  She said that they were staying with her family but had to leave and he  could not give a specific reason why. He has no income, his significant other has no income. He was not interested in a shelter at this time   He does receive food stamps.   He explained that he has tried to work but has been fired from [employer] and another company due to his " anger issues." He would like to work but understands that he needs to be able to manage his anger first.  I reviewed the information about [facility] that was on his AVS and encouraged him to take advantage of the walk in hours for an assessment, therapy. He said he received medications for behavioral health when he was in prison but he said he was not aware of being diagnosed with a behavioral health problem or aware of what medication he was taking.   He said he has been working with someone about housing and then said it was about disability. I asked if he would like me to call that person and inquire if I can provide any assistance.  He said he has the phone number in the car and would get the number for me.  He eventually gave me a number for Ms Antler # [phone], case # [id number].   I just called that number twice and the recording does not give the name of a company and then said there is a special promotion for select callers.  I will need to follow up with the patient as this is not a person who would be assisting with disability / housing. 

## 2021-08-08 NOTE — Telephone Encounter (Signed)
I called patient and explained that Dr Delford Field would like to obtain his medical records from prison and we will need him to sign a ROI so we can request those records. This will help Dr Delford Field establish the best plan of care for him.  I told him that he can sign that ROI when he comes to the lab at the clinic next week - 08/13/2021 and he said he will be here.  I put a note in the appointment notes, requesting that he sign the ROI.   He said he was at [employer] for 9 years and he has been out of prison almost 4 years. He has not had consistent medical care since he has been out.    I then told him that I called the 800 number that he gave me yesterday and there is no company associated with that number. He confirmed the number again and said he called it today and left a message for Ms Dareen Piano. He is still not sure if this is regarding disability.  I told him to have Ms Dareen Piano call me if he is able to reach her.

## 2021-08-12 ENCOUNTER — Telehealth: Payer: Self-pay

## 2021-08-12 LAB — URINE CYTOLOGY ANCILLARY ONLY
Bacterial Vaginitis-Urine: NEGATIVE
Candida Urine: NEGATIVE
Chlamydia: NEGATIVE
Comment: NEGATIVE
Comment: NEGATIVE
Comment: NORMAL
Neisseria Gonorrhea: NEGATIVE
Trichomonas: NEGATIVE

## 2021-08-12 NOTE — Progress Notes (Signed)
Let pt know no STD found urine cytology neg for Gonorrhea, chlamydia or trichomonos

## 2021-08-12 NOTE — Telephone Encounter (Signed)
-----   Message from Storm Frisk, MD sent at 08/12/2021  8:09 AM EDT ----- Let pt know no STD found urine cytology neg for Gonorrhea, chlamydia or trichomonos

## 2021-08-12 NOTE — Telephone Encounter (Signed)
Yes all labs ok

## 2021-08-12 NOTE — Telephone Encounter (Signed)
Pt was called and is aware of results, DOB was confirmed.  ?

## 2021-08-12 NOTE — Telephone Encounter (Signed)
Noted pt is aware 

## 2021-08-13 ENCOUNTER — Ambulatory Visit: Payer: Medicaid Other | Attending: Critical Care Medicine

## 2021-08-13 ENCOUNTER — Other Ambulatory Visit: Payer: Self-pay

## 2021-08-14 ENCOUNTER — Other Ambulatory Visit: Payer: Self-pay

## 2021-08-14 LAB — H. PYLORI BREATH TEST: H pylori Breath Test: NEGATIVE

## 2021-08-15 ENCOUNTER — Telehealth: Payer: Self-pay

## 2021-08-15 NOTE — Telephone Encounter (Signed)
-----   Message from Storm Frisk, MD sent at 08/15/2021  5:58 AM EDT ----- Let pt know H pylori negative stay on stomach medications

## 2021-08-15 NOTE — Telephone Encounter (Signed)
Pt was called and is aware of results, DOB was confirmed.  ?

## 2021-08-15 NOTE — Progress Notes (Signed)
Let pt know H pylori negative stay on stomach medications

## 2021-08-15 NOTE — Telephone Encounter (Signed)
Patient is wanting to know why his stomach is hurting and I did remind him of his scan on Tuesday

## 2021-08-20 ENCOUNTER — Inpatient Hospital Stay: Admission: RE | Admit: 2021-08-20 | Payer: Medicaid Other | Source: Ambulatory Visit

## 2022-09-26 DIAGNOSIS — K047 Periapical abscess without sinus: Secondary | ICD-10-CM | POA: Diagnosis not present

## 2023-04-13 DIAGNOSIS — Z1159 Encounter for screening for other viral diseases: Secondary | ICD-10-CM | POA: Diagnosis not present

## 2023-04-13 DIAGNOSIS — Z0000 Encounter for general adult medical examination without abnormal findings: Secondary | ICD-10-CM | POA: Diagnosis not present

## 2023-04-13 DIAGNOSIS — Z113 Encounter for screening for infections with a predominantly sexual mode of transmission: Secondary | ICD-10-CM | POA: Diagnosis not present

## 2023-04-13 DIAGNOSIS — Z125 Encounter for screening for malignant neoplasm of prostate: Secondary | ICD-10-CM | POA: Diagnosis not present

## 2023-04-23 DIAGNOSIS — R21 Rash and other nonspecific skin eruption: Secondary | ICD-10-CM | POA: Diagnosis not present

## 2023-05-07 DIAGNOSIS — M5412 Radiculopathy, cervical region: Secondary | ICD-10-CM | POA: Diagnosis not present

## 2023-05-27 DIAGNOSIS — M79604 Pain in right leg: Secondary | ICD-10-CM | POA: Diagnosis not present

## 2023-05-27 DIAGNOSIS — Z1211 Encounter for screening for malignant neoplasm of colon: Secondary | ICD-10-CM | POA: Diagnosis not present

## 2023-05-27 DIAGNOSIS — K921 Melena: Secondary | ICD-10-CM | POA: Diagnosis not present

## 2023-05-27 DIAGNOSIS — R1013 Epigastric pain: Secondary | ICD-10-CM | POA: Diagnosis not present

## 2023-05-27 DIAGNOSIS — M5412 Radiculopathy, cervical region: Secondary | ICD-10-CM | POA: Diagnosis not present

## 2023-06-03 DIAGNOSIS — R351 Nocturia: Secondary | ICD-10-CM | POA: Diagnosis not present

## 2023-06-03 DIAGNOSIS — R972 Elevated prostate specific antigen [PSA]: Secondary | ICD-10-CM | POA: Diagnosis not present

## 2023-06-16 DIAGNOSIS — R972 Elevated prostate specific antigen [PSA]: Secondary | ICD-10-CM | POA: Diagnosis not present

## 2023-06-16 DIAGNOSIS — N4289 Other specified disorders of prostate: Secondary | ICD-10-CM | POA: Diagnosis not present
